# Patient Record
Sex: Female | Born: 1975 | Race: Black or African American | Hispanic: No | Smoking: Never smoker
Health system: Southern US, Community
[De-identification: ages and names within clinical notes are randomized; demographics above are authoritative.]

---

## 2004-08-03 ENCOUNTER — Emergency Department: Payer: Self-pay | Admitting: Emergency Medicine

## 2006-03-02 ENCOUNTER — Emergency Department: Payer: Self-pay | Admitting: Emergency Medicine

## 2006-08-26 ENCOUNTER — Emergency Department: Payer: Self-pay

## 2006-12-26 ENCOUNTER — Ambulatory Visit: Payer: Self-pay | Admitting: Family Medicine

## 2007-03-12 ENCOUNTER — Ambulatory Visit: Payer: Self-pay

## 2007-08-26 ENCOUNTER — Ambulatory Visit: Payer: Self-pay | Admitting: Certified Nurse Midwife

## 2008-01-22 ENCOUNTER — Emergency Department: Payer: Self-pay | Admitting: Emergency Medicine

## 2008-04-08 ENCOUNTER — Emergency Department: Payer: Self-pay | Admitting: Internal Medicine

## 2008-06-21 ENCOUNTER — Emergency Department: Payer: Self-pay | Admitting: Emergency Medicine

## 2009-02-03 ENCOUNTER — Ambulatory Visit: Payer: Self-pay | Admitting: Internal Medicine

## 2009-02-07 ENCOUNTER — Ambulatory Visit: Payer: Self-pay | Admitting: Internal Medicine

## 2009-02-17 ENCOUNTER — Ambulatory Visit: Payer: Self-pay | Admitting: Internal Medicine

## 2009-08-22 ENCOUNTER — Ambulatory Visit: Payer: Self-pay | Admitting: Internal Medicine

## 2009-11-01 ENCOUNTER — Emergency Department: Payer: Self-pay | Admitting: Emergency Medicine

## 2010-12-03 ENCOUNTER — Emergency Department: Payer: Self-pay | Admitting: Emergency Medicine

## 2014-12-13 ENCOUNTER — Encounter: Payer: Self-pay | Admitting: Emergency Medicine

## 2014-12-13 ENCOUNTER — Emergency Department
Admission: EM | Admit: 2014-12-13 | Discharge: 2014-12-13 | Disposition: A | Discharge status: Home / Self Care | Payer: Federal, State, Local not specified - PPO | Attending: Emergency Medicine | Admitting: Emergency Medicine

## 2014-12-13 ENCOUNTER — Emergency Department: Payer: Federal, State, Local not specified - PPO

## 2014-12-13 DIAGNOSIS — R202 Paresthesia of skin: Secondary | ICD-10-CM | POA: Diagnosis not present

## 2014-12-13 DIAGNOSIS — Z880 Allergy status to penicillin: Secondary | ICD-10-CM | POA: Insufficient documentation

## 2014-12-13 DIAGNOSIS — G43909 Migraine, unspecified, not intractable, without status migrainosus: Secondary | ICD-10-CM | POA: Diagnosis not present

## 2014-12-13 DIAGNOSIS — Z79899 Other long term (current) drug therapy: Secondary | ICD-10-CM | POA: Diagnosis not present

## 2014-12-13 DIAGNOSIS — R42 Dizziness and giddiness: Secondary | ICD-10-CM | POA: Diagnosis present

## 2014-12-13 DIAGNOSIS — Z793 Long term (current) use of hormonal contraceptives: Secondary | ICD-10-CM | POA: Diagnosis not present

## 2014-12-13 MED ORDER — METOCLOPRAMIDE HCL 5 MG/ML IJ SOLN
INTRAMUSCULAR | Status: AC
  Filled 2014-12-13: qty 4

## 2014-12-13 MED ORDER — DIPHENHYDRAMINE HCL 50 MG/ML IJ SOLN
INTRAMUSCULAR | Status: AC
  Administered 2014-12-13: 25 mg via INTRAVENOUS
  Filled 2014-12-13: qty 1

## 2014-12-13 MED ORDER — KETOROLAC TROMETHAMINE 30 MG/ML IJ SOLN
30.0000 mg | Freq: Once | INTRAMUSCULAR | Status: AC
  Administered 2014-12-13: 30 mg via INTRAVENOUS

## 2014-12-13 MED ORDER — BUTALBITAL-APAP-CAFFEINE 50-325-40 MG PO TABS: 1.0000 | ORAL_TABLET | Freq: Four times a day (QID) | ORAL | Status: AC | PRN

## 2014-12-13 MED ORDER — DIPHENHYDRAMINE HCL 50 MG/ML IJ SOLN
25.0000 mg | Freq: Once | INTRAMUSCULAR | Status: AC
  Administered 2014-12-13: 25 mg via INTRAVENOUS

## 2014-12-13 MED ORDER — KETOROLAC TROMETHAMINE 30 MG/ML IJ SOLN
INTRAMUSCULAR | Status: AC
  Administered 2014-12-13: 30 mg via INTRAVENOUS
  Filled 2014-12-13: qty 1

## 2014-12-13 MED ORDER — METOCLOPRAMIDE HCL 5 MG/ML IJ SOLN
20.0000 mg | Freq: Once | INTRAVENOUS | Status: AC
  Administered 2014-12-13: 20 mg via INTRAVENOUS
  Filled 2014-12-13: qty 4

## 2014-12-13 NOTE — ED Provider Notes (Signed)
[facility] Emergency Department Provider Note  ____________________________________________  Time seen: 1 PM  I have reviewed the triage vital signs and the nursing notes.   HISTORY  Chief Complaint Dizziness    HPI JOBY HERSHKOWITZ is a 39 y.o. female who presents with a headache for one week that is throbbing and primarily on the right side of her head. Patient has a history of migraines. She denies fevers chills. No neck pain. She became concerned because this morning she had tingling in her face and her right arm although no weakness. Symptoms resolved prior to arrival. She also notes vertigo-like symptoms that are worse when lying flat     History reviewed. No pertinent past medical history.  There are no active problems to display for this patient.   No past surgical history on file.  Current Outpatient Rx  Name  Route  Sig  Dispense  Refill  . norgestimate-ethinyl estradiol (ORTHO-CYCLEN,SPRINTEC,PREVIFEM) 0.25-35 MG-MCG tablet   Oral   Take 1 tablet by mouth daily.         . rizatriptan (MAXALT) 10 MG tablet   Oral   Take 10 mg by mouth as needed for migraine. May repeat in 2 hours if needed           Allergies Amoxicillin  No family history on file.  Social History History  Substance Use Topics  . Smoking status: Never Smoker   . Smokeless tobacco: Not on file  . Alcohol Use: 1.2 oz/week    2 Standard drinks or equivalent per week    Review of Systems  Constitutional: Negative for fever. Eyes: Negative for visual changes. ENT: Negative for sore throat. Positive for vertigo Cardiovascular: Negative for chest pain. Respiratory: Negative for shortness of breath. Gastrointestinal: Negative for abdominal pain, vomiting and diarrhea. Genitourinary: Negative for dysuria. Musculoskeletal: Negative for back pain. Skin: Negative for rash. Neurological: Positive for headache, positive for tingling Psychiatric: Negative for  anxiety  10-point ROS otherwise negative.  ____________________________________________   PHYSICAL EXAM:  VITAL SIGNS: BP 111/79 mmHg  Pulse 77  Resp 15  SpO2 98%    ED Triage Vitals  Enc Vitals Group     BP --      Pulse --      Resp --      Temp --      Temp src --      SpO2 --      Weight --      Height --      Head Cir --      Peak Flow --      Pain Score 12/13/14 0910 7     Pain Loc --      Pain Edu? --      Excl. in GC? --      Constitutional: Alert and oriented. Well appearing and in no distress. Eyes: Conjunctivae are normal. PERRL. Normal extraocular movements. ENT   Head: Normocephalic and atraumatic.   Nose: No congestion/rhinnorhea.   Mouth/Throat: Mucous membranes are moist.   Neck: No stridor. Hematological/Lymphatic/Immunilogical: No cervical lymphadenopathy. Cardiovascular: Normal rate, regular rhythm. Normal and symmetric distal pulses are present in all extremities. No murmurs, rubs, or gallops. Respiratory: Normal respiratory effort without tachypnea nor retractions. Breath sounds are clear and equal bilaterally. No wheezes/rales/rhonchi. Gastrointestinal: Soft and nontender. No distention. There is no CVA tenderness. Genitourinary: deferred Musculoskeletal: Nontender with normal range of motion in all extremities. No joint effusions.  No lower extremity tenderness nor edema. Neurologic:  Normal speech and language. No gross focal neurologic deficits are appreciated. Speech is normal.  Skin:  Skin is warm, dry and intact. No rash noted. Psychiatric: Mood and affect are normal. Speech and behavior are normal. Patient exhibits appropriate insight and judgment.  ____________________________________________    LABS (pertinent positives/negatives)  None  ____________________________________________   EKG  None  ____________________________________________    RADIOLOGY  Normal head  CT  ____________________________________________   PROCEDURES  Procedure(s) performed: None  Critical Care performed: None   ____________________________________________   INITIAL IMPRESSION / ASSESSMENT AND PLAN / ED COURSE  Pertinent labs & imaging results that were available during my care of the patient were reviewed by me and considered in my medical decision making (see chart for details).  Patient well-appearing. No neurologic deficits. Suspect migraine as source of discomfort. We will treat with IV medications in an attempt to provide patient with relief   ----------------------------------------- 2:54 PM on 12/13/2014 -----------------------------------------  Patient feeling significantly better. Asymptomatic ____________________________________________   FINAL CLINICAL IMPRESSION(S) / ED DIAGNOSES  Final diagnoses:  Migraine without status migrainosus, not intractable, unspecified migraine type     Jene Everyobert Nansi Birmingham, MD 12/15/14 1300

## 2014-12-13 NOTE — Discharge Instructions (Signed)

## 2014-12-13 NOTE — ED Notes (Signed)
States has had headache x 1 week, has history of migraines but states this feels different, states woke this am with dizzyness and some numbness R face, no droop. Denies sinus drainage.

## 2014-12-13 NOTE — ED Notes (Signed)
Pt resting in bed, lights off at this time.

## 2015-09-18 ENCOUNTER — Emergency Department: Payer: Federal, State, Local not specified - PPO

## 2015-09-18 ENCOUNTER — Emergency Department
Admission: EM | Admit: 2015-09-18 | Discharge: 2015-09-18 | Disposition: A | Discharge status: Home / Self Care | Payer: Federal, State, Local not specified - PPO | Attending: Emergency Medicine | Admitting: Emergency Medicine

## 2015-09-18 ENCOUNTER — Encounter: Payer: Self-pay | Admitting: *Deleted

## 2015-09-18 DIAGNOSIS — M79605 Pain in left leg: Secondary | ICD-10-CM | POA: Diagnosis not present

## 2015-09-18 DIAGNOSIS — Z880 Allergy status to penicillin: Secondary | ICD-10-CM | POA: Diagnosis not present

## 2015-09-18 MED ORDER — TRAMADOL HCL 50 MG PO TABS: 50.0000 mg | ORAL_TABLET | Freq: Four times a day (QID) | ORAL | Status: AC | PRN

## 2015-09-18 MED ORDER — IBUPROFEN 800 MG PO TABS: 800.0000 mg | ORAL_TABLET | Freq: Three times a day (TID) | ORAL | Status: AC | PRN

## 2015-09-18 NOTE — ED Notes (Signed)
Pt reports left lower leg pain for 1 day.  Sx began while driving today.  Pt states left calf hurts with pain radiating into left foot.

## 2015-09-18 NOTE — Discharge Instructions (Signed)

## 2015-09-18 NOTE — ED Provider Notes (Signed)
[facility] Emergency Department Provider Note     Time seen: ----------------------------------------- 9:21 PM on 09/18/2015 -----------------------------------------    I have reviewed the triage vital signs and the nursing notes.   HISTORY  Chief Complaint Leg Pain    HPI Kiara Barker is a 40 y.o. female who presents to ER with left lower leg pain for last 24 hours. Patient states symptoms began while driving today, states her left calf hurts the pain radiates into her foot. She's not had this happen before, denies any recent injuries or trauma. She denies swelling, fevers chills or other complaints.   No past medical history on file.  There are no active problems to display for this patient.   No past surgical history on file.  Allergies Amoxicillin  Social History Social History  Substance Use Topics  . Smoking status: Never Smoker   . Smokeless tobacco: None  . Alcohol Use: 1.2 oz/week    2 Standard drinks or equivalent per week    Review of Systems Constitutional: Negative for fever. Eyes: Negative for visual changes. ENT: Negative for sore throat. Cardiovascular: Negative for chest pain. Respiratory: Negative for shortness of breath. Gastrointestinal: Negative for abdominal pain, vomiting and diarrhea. Genitourinary: Negative for dysuria. Musculoskeletal: Positive for left leg pain Skin: Negative for rash. Neurological: Negative for headaches, focal weakness or numbness.  10-point ROS otherwise negative.  ____________________________________________   PHYSICAL EXAM:  VITAL SIGNS: ED Triage Vitals  Enc Vitals Group     BP 09/18/15 2031 138/90 mmHg     Pulse Rate 09/18/15 2031 82     Resp 09/18/15 2031 20     Temp 09/18/15 2031 98.3 F (36.8 C)     Temp src --      SpO2 09/18/15 2031 99 %     Weight 09/18/15 2031 200 lb (90.719 kg)     Height 09/18/15 2031  (1.626 m)     Head Cir --      Peak Flow --      Pain Score 09/18/15 2032 10     Pain Loc --      Pain Edu? --      Excl. in GC? --     Constitutional: Alert and oriented. Well appearing and in no distress. Eyes: Conjunctivae are normal. PERRL. Normal extraocular movements. ENT   Head: Normocephalic and atraumatic.   Nose: No congestion/rhinnorhea.   Mouth/Throat: Mucous membranes are moist.   Neck: No stridor. Cardiovascular: Normal rate, regular rhythm. Normal and symmetric distal pulses are present in all extremities. ABIs are normal. No murmurs, rubs, or gallops. Respiratory: Normal respiratory effort without tachypnea nor retractions. Breath sounds are clear and equal bilaterally. No wheezes/rales/rhonchi. Gastrointestinal: Soft and nontender. No distention. No abdominal bruits.  Musculoskeletal: Nontender with normal range of motion in all extremities. No joint effusions.  No lower extremity tenderness nor edema. Neurologic:  Normal speech and language. No gross focal neurologic deficits are appreciated. Speech is normal. No gait instability. Skin:  Skin is warm, dry and intact. No rash noted. Psychiatric: Mood and affect are normal. Speech and behavior are normal. Patient exhibits appropriate insight and judgment. ____________________________________________  ED COURSE:  Pertinent labs & imaging results that were available during my care of the patient were reviewed by me and considered in my medical decision making (see chart for details). Patient is in no acute distress, no clear etiology for her symptoms. Likely muscle cramps.  RADIOLOGY Lower extremity ultrasound Is unremarkable ____________________________________________  FINAL ASSESSMENT AND PLAN  Leg pain  Plan: Patient with imaging as dictated above. I will prescribe Motrin and tramadol for her symptoms. She is advised to follow-up with her doctor in 2 days for recheck.   Emily Filbert, MD   Emily Filbert, MD 09/18/15 [phone]

## 2015-09-27 ENCOUNTER — Other Ambulatory Visit: Payer: Self-pay | Admitting: Nurse Practitioner

## 2015-09-27 DIAGNOSIS — R109 Unspecified abdominal pain: Principal | ICD-10-CM

## 2015-09-27 DIAGNOSIS — G8929 Other chronic pain: Secondary | ICD-10-CM

## 2015-10-04 ENCOUNTER — Ambulatory Visit
Admission: RE | Admit: 2015-10-04 | Discharge: 2015-10-04 | Disposition: A | Discharge status: Home / Self Care | Payer: Federal, State, Local not specified - PPO | Source: Ambulatory Visit | Attending: Nurse Practitioner | Admitting: Nurse Practitioner

## 2015-10-04 DIAGNOSIS — G8929 Other chronic pain: Secondary | ICD-10-CM | POA: Diagnosis present

## 2015-10-04 DIAGNOSIS — R109 Unspecified abdominal pain: Secondary | ICD-10-CM | POA: Insufficient documentation

## 2015-10-04 MED ORDER — IOHEXOL 300 MG/ML  SOLN
100.0000 mL | Freq: Once | INTRAMUSCULAR | Status: AC | PRN
  Administered 2015-10-04: 100 mL via INTRAVENOUS

## 2016-10-23 IMAGING — CT CT ABD-PELV W/ CM
2 of 4 series · 16 of 46 positions shown, 18 images · IV contrast (omnipaque)
Comparison: None.

CLINICAL DATA: Upper abdominal and epigastric pain for 6 months.

EXAM:
CT ABDOMEN AND PELVIS WITH CONTRAST
TECHNIQUE: Multidetector CT imaging of the abdomen and pelvis was performed
using the standard protocol following bolus administration of
intravenous contrast.
CONTRAST:  100mL OMNIPAQUE IOHEXOL 300 MG/ML  SOLN

[Series 2: axial soft tissue · axial · 0.61mm/px · z∈[-727,-297]mm · 13 of 96 slices shown, 15 images]
[im 5/96  soft-tissue]
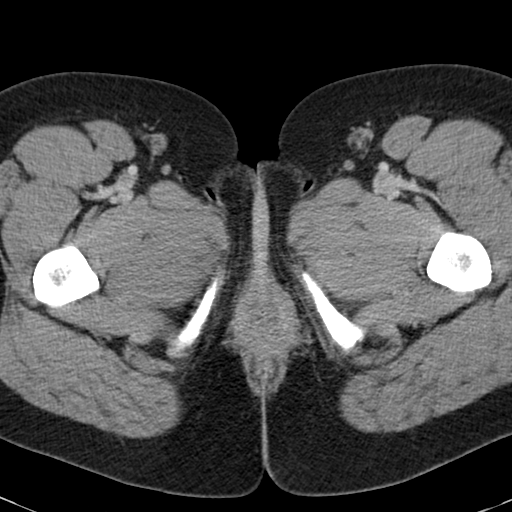
[im 5/96  bone]
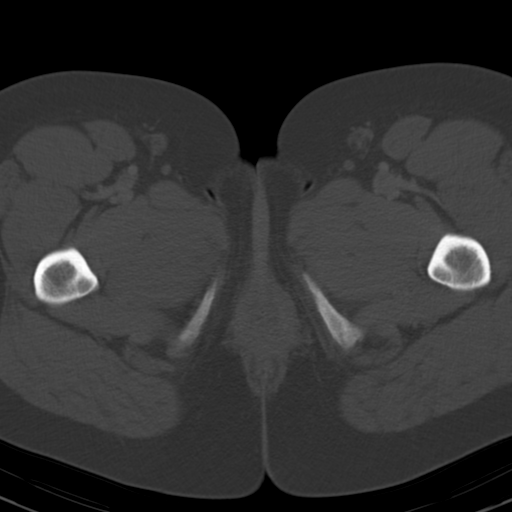
[im 13/96  soft-tissue]
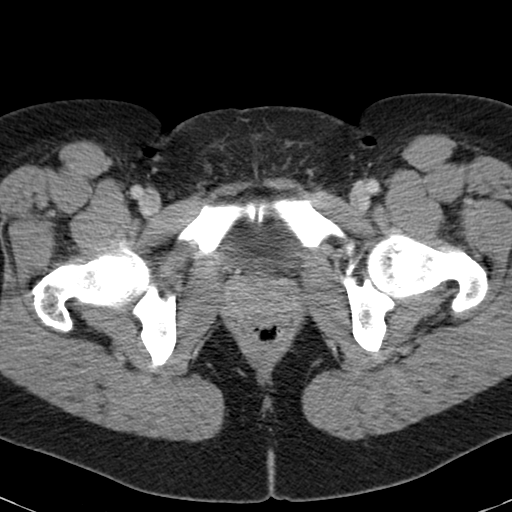
[im 21/96  soft-tissue]
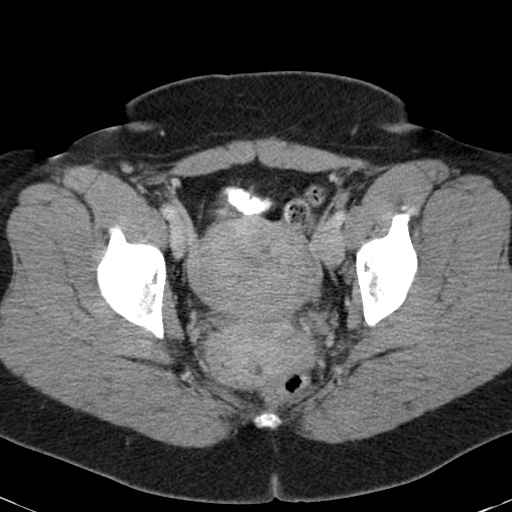
[im 25/96  soft-tissue]
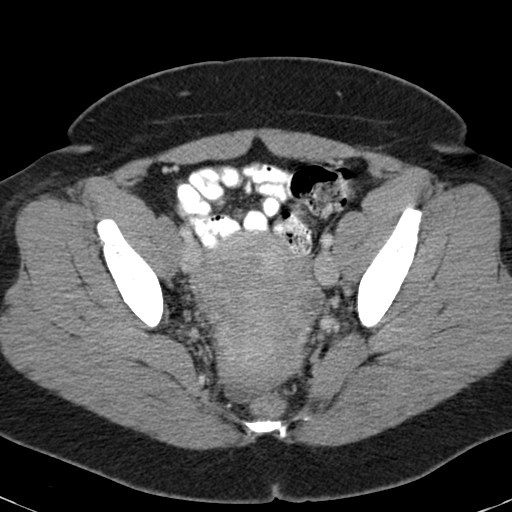
[im 34/96  soft-tissue]
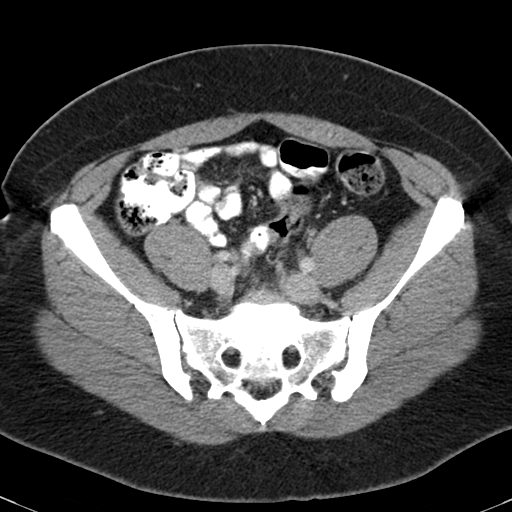
[im 42/96  soft-tissue]
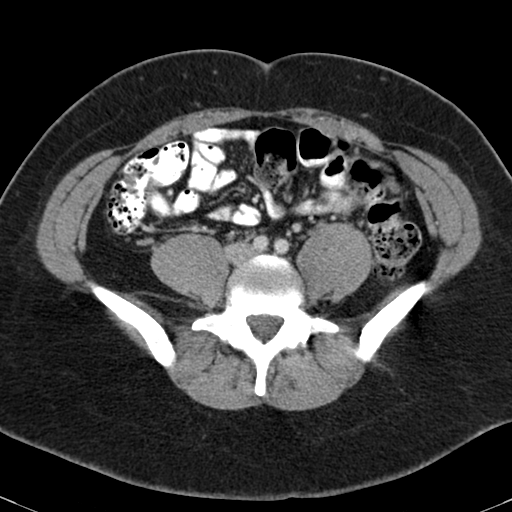
[im 50/96  soft-tissue]
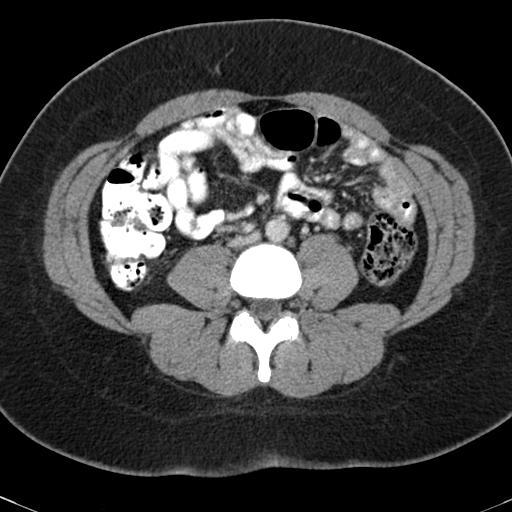
[im 54/96  soft-tissue]
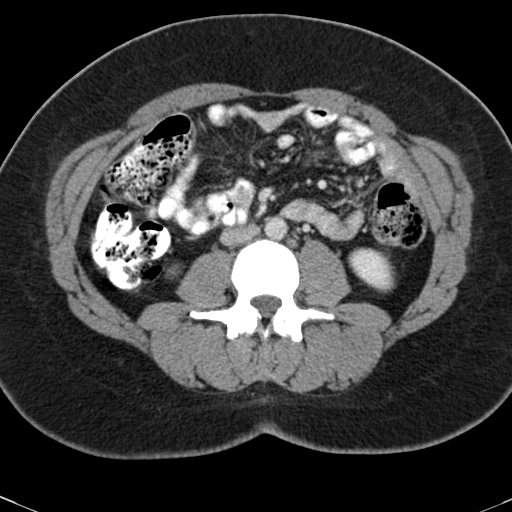
[im 62/96  soft-tissue]
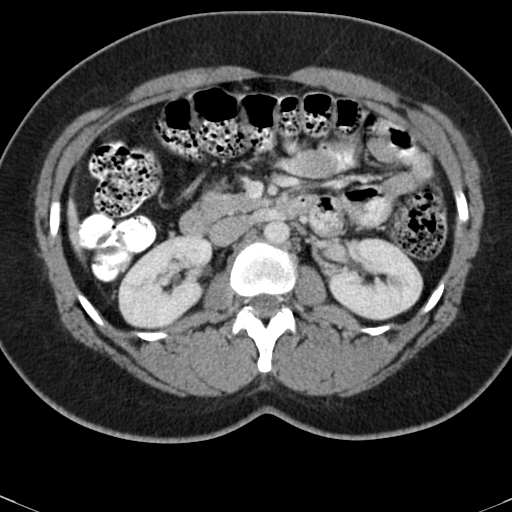
[im 62/96  bone]
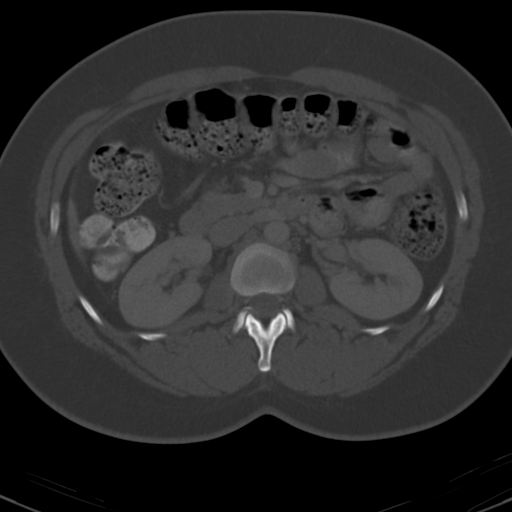
[im 71/96  soft-tissue]
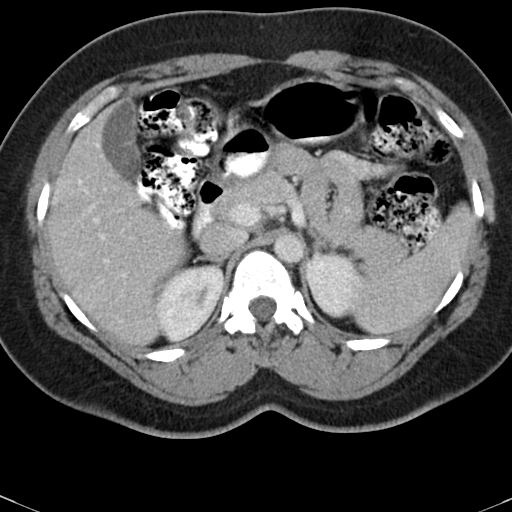
[im 75/96  soft-tissue]
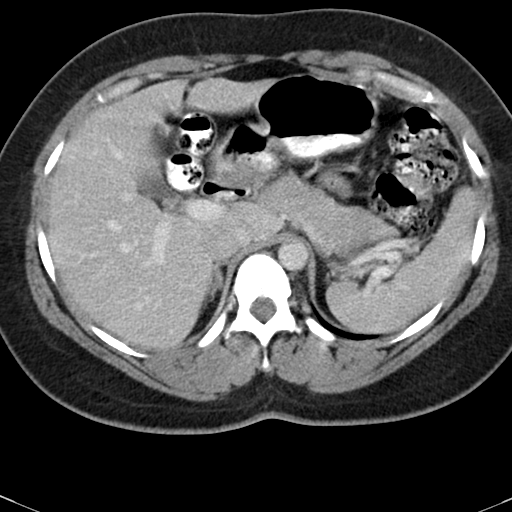
[im 83/96  soft-tissue]
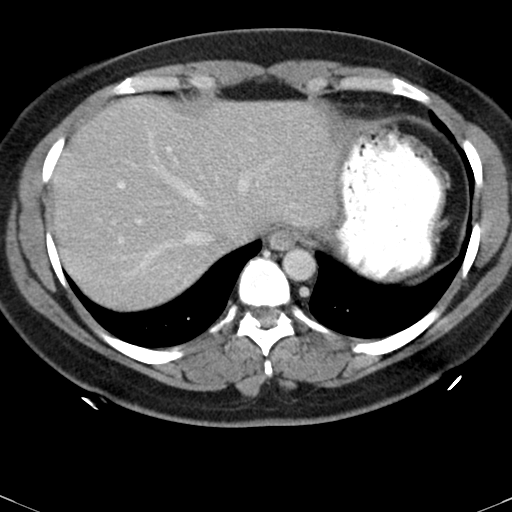
[im 91/96  soft-tissue]
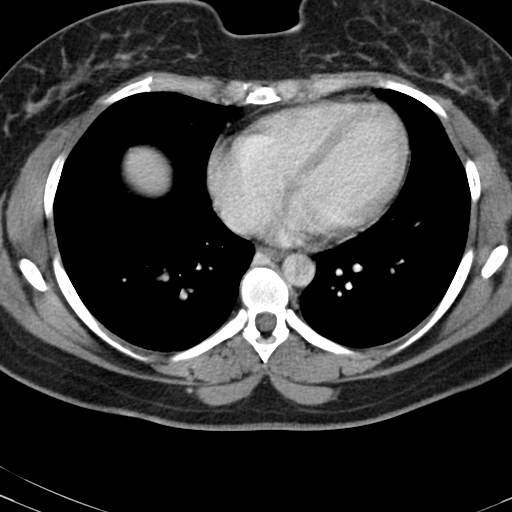

[Series 602: coronal · coronal · 0.92mm/px · 3 of 115 slices shown]
[im 39/115  soft-tissue]
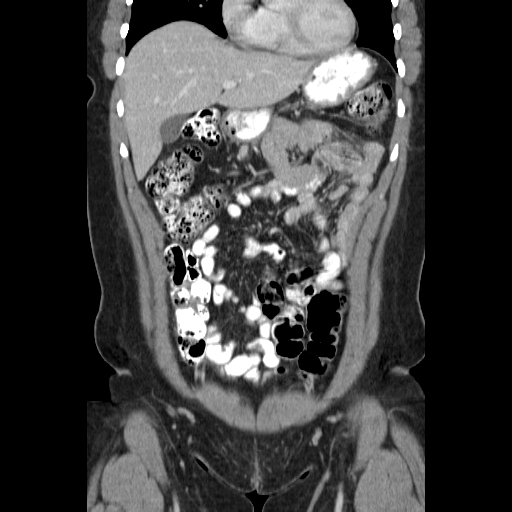
[im 51/115  soft-tissue]
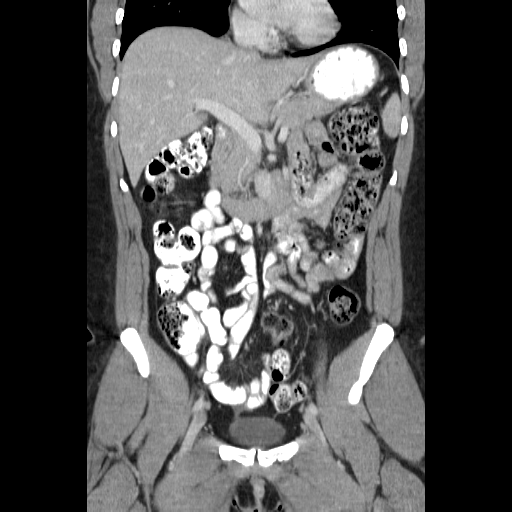
[im 64/115  soft-tissue]
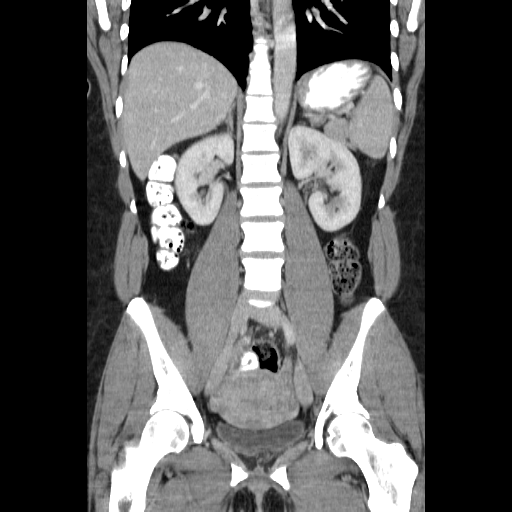

[16 of 46 positions shown; findings below may reference images not displayed]

FINDINGS: Lower chest:  Unremarkable.

Hepatobiliary: No focal abnormality within the liver parenchyma.
There is no evidence for gallstones, gallbladder wall thickening, or
pericholecystic fluid. No intrahepatic or extrahepatic biliary
dilation.

Pancreas: No focal mass lesion. No dilatation of the main duct. No
intraparenchymal cyst. No peripancreatic edema.

Spleen: No splenomegaly. No focal mass lesion.

Adrenals/Urinary Tract: No adrenal nodule or mass. Kidneys are
unremarkable. No evidence for hydroureter. The urinary bladder
appears normal for the degree of distention.

Stomach/Bowel: Stomach is nondistended. No gastric wall thickening.
No evidence of outlet obstruction. Duodenum is normally positioned
as is the ligament of Treitz. No small bowel wall thickening. No
small bowel dilatation. The terminal ileum is normal. The appendix
is normal. No gross colonic mass. No colonic wall thickening. No
substantial diverticular change.

Vascular/Lymphatic: No abdominal aortic aneurysm. No abdominal
aortic atherosclerotic calcification. There is no gastrohepatic or
hepatoduodenal ligament lymphadenopathy. No intraperitoneal or
retroperitoneal lymphadenopathy. No pelvic sidewall lymphadenopathy.

Reproductive: The uterus has normal CT imaging appearance. Dominant
follicle noted right ovary. Left ovary unremarkable.

Other: Trace amount of free fluid is identified in the cul-de-sac.

Musculoskeletal: Bone windows reveal no worrisome lytic or sclerotic
osseous lesions.
IMPRESSION: 1. No acute findings in the abdomen or pelvis. Specifically, no
features to explain the patient's history of pain.

## 2019-06-01 ENCOUNTER — Other Ambulatory Visit: Payer: Self-pay

## 2019-06-01 DIAGNOSIS — Z20822 Contact with and (suspected) exposure to covid-19: Secondary | ICD-10-CM

## 2019-06-04 LAB — NOVEL CORONAVIRUS, NAA: SARS-CoV-2, NAA: NOT DETECTED

## 2021-02-23 ENCOUNTER — Other Ambulatory Visit: Payer: Self-pay | Admitting: Family Medicine

## 2021-02-23 ENCOUNTER — Other Ambulatory Visit (HOSPITAL_COMMUNITY): Payer: Self-pay | Admitting: Family Medicine

## 2021-02-23 DIAGNOSIS — R102 Pelvic and perineal pain: Secondary | ICD-10-CM

## 2021-02-27 ENCOUNTER — Ambulatory Visit
Admission: RE | Admit: 2021-02-27 | Discharge: 2021-02-27 | Disposition: A | Discharge status: Home / Self Care | Payer: BC Managed Care – PPO | Source: Ambulatory Visit | Attending: Family Medicine | Admitting: Family Medicine

## 2021-02-27 ENCOUNTER — Other Ambulatory Visit: Payer: Self-pay

## 2021-02-27 DIAGNOSIS — R102 Pelvic and perineal pain: Secondary | ICD-10-CM | POA: Diagnosis present

## 2021-07-19 ENCOUNTER — Other Ambulatory Visit: Payer: Self-pay

## 2021-07-19 ENCOUNTER — Ambulatory Visit
Admission: EM | Admit: 2021-07-19 | Discharge: 2021-07-19 | Disposition: A | Discharge status: Home / Self Care | Payer: 59

## 2021-07-19 DIAGNOSIS — H6123 Impacted cerumen, bilateral: Secondary | ICD-10-CM

## 2021-07-19 DIAGNOSIS — H9201 Otalgia, right ear: Secondary | ICD-10-CM

## 2021-07-19 NOTE — ED Notes (Signed)
Ear was/removal attempted in right ear (stopped due to pain). Left ear (wax removed completely). Advised provider.

## 2021-07-19 NOTE — ED Triage Notes (Signed)
Patient is here for "Right Ear pain". "A lot of ear impactions with ear wax often". Yesterday started hurting, put some ear wax removal to help, & did dig in it some". No fever. No other injury known. No URI symptoms.

## 2021-07-19 NOTE — ED Provider Notes (Signed)
[facility]    CSN: [id number] Arrival date & time: 07/19/21  0825      History   Chief Complaint Chief Complaint  Patient presents with   Otalgia    Right     HPI Kiara Barker is a 45 y.o. female presenting for right-sided ear pain for the past several days.  Patient reports Kiara Barker has a lot of problems with earwax buildup in her ears.  Kiara Barker says Kiara Barker tried remove the wax by letting some water run into the ear and using a rag to wipe away the wax yesterday.  Reports no improvement and Kiara Barker felt like there was a hard ball of wax there.  Reports that Kiara Barker woke up today with severe pain and the pain is not gone away.  No drainage from ear.  No fevers.  Hearing seems to be normal.  No dizziness, headaches or vomiting reported.  No other complaints.  HPI  History reviewed. No pertinent past medical history.  There are no problems to display for this patient.   History reviewed. No pertinent surgical history.  OB History   No obstetric history on file.      Home Medications    Prior to Admission medications   Medication Sig Start Date End Date Taking? Authorizing Provider  acetaminophen (TYLENOL) 325 MG tablet Take 650 mg by mouth every 6 (six) hours as needed.   Yes [provider]  UNABLE TO FIND Med Name: Ear gtts (name?)   Yes [provider]  ibuprofen (ADVIL,MOTRIN) 800 MG tablet Take 1 tablet (800 mg total) by mouth every 8 (eight) hours as needed. 09/18/15   Emily Filbert, MD  norgestimate-ethinyl estradiol (ORTHO-CYCLEN,SPRINTEC,PREVIFEM) 0.25-35 MG-MCG tablet Take 1 tablet by mouth daily.    [provider]  rizatriptan (MAXALT) 10 MG tablet Take 10 mg by mouth as needed for migraine. May repeat in 2 hours if needed    [provider]    Family History No family history on file.  Social History Social History   Tobacco Use   Smoking status: Never  Vaping Use   Vaping Use: Never used  Substance Use Topics    Alcohol use: Yes    Alcohol/week: 2.0 standard drinks    Types: 2 Standard drinks or equivalent per week    Comment: Occassionally.   Drug use: Not Currently     Allergies   Ampicillin, Clindamycin hcl, and Amoxicillin   Review of Systems Review of Systems  Constitutional:  Negative for chills, diaphoresis, fatigue and fever.  HENT:  Positive for ear pain. Negative for congestion, ear discharge, hearing loss, rhinorrhea and sore throat.   Respiratory:  Negative for cough.   Gastrointestinal:  Negative for nausea and vomiting.  Musculoskeletal:  Negative for myalgias.  Skin:  Negative for rash.  Neurological:  Negative for dizziness, weakness and headaches.  Hematological:  Negative for adenopathy.    Physical Exam Triage Vital Signs ED Triage Vitals [07/19/21 0941]  Enc Vitals Group     BP 115/79     Pulse Rate 83     Resp 18     Temp 98.3 F (36.8 C)     Temp Source Oral     SpO2 100 %     Weight 175 lb (79.4 kg)     Height      Head Circumference      Peak Flow      Pain Score 10     Pain Loc  Pain Edu?      Excl. in GC?    No data found.  Updated Vital Signs BP 115/79 (BP Location: Left Arm)    Pulse 83    Temp 98.3 F (36.8 C) (Oral)    Resp 18    Wt 175 lb (79.4 kg)    LMP  (LMP Unknown) Comment: 2018   SpO2 100%    BMI 30.04 kg/m      Physical Exam Vitals and nursing note reviewed.  Constitutional:      General: Kiara Barker is not in acute distress.    Appearance: Normal appearance. Kiara Barker is not ill-appearing or toxic-appearing.  HENT:     Head: Normocephalic and atraumatic.     Right Ear: Hearing and external ear normal. There is impacted cerumen.     Left Ear: Hearing and external ear normal. There is impacted cerumen.     Nose: Nose normal.     Mouth/Throat:     Mouth: Mucous membranes are moist.     Pharynx: Oropharynx is clear.  Eyes:     General: No scleral icterus.       Right eye: No discharge.        Left eye: No discharge.      Conjunctiva/sclera: Conjunctivae normal.  Cardiovascular:     Rate and Rhythm: Normal rate and regular rhythm.     Heart sounds: Normal heart sounds.  Pulmonary:     Effort: Pulmonary effort is normal. No respiratory distress.     Breath sounds: Normal breath sounds.  Musculoskeletal:     Cervical back: Neck supple.  Skin:    General: Skin is dry.  Neurological:     General: No focal deficit present.     Mental Status: Kiara Barker is alert. Mental status is at baseline.     Motor: No weakness.     Gait: Gait normal.  Psychiatric:        Mood and Affect: Mood normal.        Behavior: Behavior normal.        Thought Content: Thought content normal.     UC Treatments / Results  Labs (all labs ordered are listed, but only abnormal results are displayed) Labs Reviewed - No data to display  EKG   Radiology No results found.  Procedures Procedures (including critical care time)  Medications Ordered in UC Medications - No data to display  Initial Impression / Assessment and Plan / UC Course  I have reviewed the triage vital signs and the nursing notes.  Pertinent labs & imaging results that were available during my care of the patient were reviewed by me and considered in my medical decision making (see chart for details).  45 year old female presenting for right-sided ear pain.  On exam Kiara Barker has cerumen impaction bilaterally.  Otic flush performed by nursing staff.  Patient had some difficulty tolerating it on the right ear but was able to push through the pain.  Her examination shows clear EACs bilaterally.  No evidence of infection bilaterally.  Patient still having some discomfort but reports being able to hear better.  Advised Tylenol/Motrin and warm compresses.  Advised to follow-up if your pain is worsening or Kiara Barker has ear drainage, or fever.  Final Clinical Impressions(s) / UC Diagnoses   Final diagnoses:  Bilateral impacted cerumen  Right ear pain   Discharge Instructions    None    ED Prescriptions   None    PDMP not reviewed this encounter.   Michiel Cowboy,  Algis Greenhouse, PA-C 07/19/21 1237

## 2022-09-06 ENCOUNTER — Other Ambulatory Visit: Payer: Self-pay | Admitting: Family Medicine

## 2022-09-06 DIAGNOSIS — R102 Pelvic and perineal pain unspecified side: Secondary | ICD-10-CM

## 2022-09-06 DIAGNOSIS — N9412 Deep dyspareunia: Secondary | ICD-10-CM

## 2022-09-11 ENCOUNTER — Encounter: Payer: Self-pay | Admitting: Family Medicine

## 2022-09-11 DIAGNOSIS — R102 Pelvic and perineal pain: Secondary | ICD-10-CM

## 2022-09-11 DIAGNOSIS — N9412 Deep dyspareunia: Secondary | ICD-10-CM

## 2022-09-17 ENCOUNTER — Ambulatory Visit
Admission: RE | Admit: 2022-09-17 | Discharge: 2022-09-17 | Disposition: A | Discharge status: Home / Self Care | Payer: 59 | Source: Ambulatory Visit | Attending: Family Medicine | Admitting: Family Medicine

## 2022-09-17 DIAGNOSIS — N9412 Deep dyspareunia: Secondary | ICD-10-CM

## 2022-09-17 DIAGNOSIS — R102 Pelvic and perineal pain: Secondary | ICD-10-CM

## 2023-02-06 ENCOUNTER — Ambulatory Visit
Admission: EM | Admit: 2023-02-06 | Discharge: 2023-02-06 | Disposition: A | Discharge status: Home / Self Care | Payer: 59 | Attending: Emergency Medicine | Admitting: Emergency Medicine

## 2023-02-06 ENCOUNTER — Ambulatory Visit (INDEPENDENT_AMBULATORY_CARE_PROVIDER_SITE_OTHER): Payer: 59

## 2023-02-06 DIAGNOSIS — Z1152 Encounter for screening for COVID-19: Secondary | ICD-10-CM | POA: Diagnosis not present

## 2023-02-06 DIAGNOSIS — K5900 Constipation, unspecified: Secondary | ICD-10-CM | POA: Diagnosis present

## 2023-02-06 DIAGNOSIS — H6123 Impacted cerumen, bilateral: Secondary | ICD-10-CM | POA: Diagnosis not present

## 2023-02-06 DIAGNOSIS — R42 Dizziness and giddiness: Secondary | ICD-10-CM | POA: Insufficient documentation

## 2023-02-06 LAB — SARS CORONAVIRUS 2 BY RT PCR: SARS Coronavirus 2 by RT PCR: NEGATIVE

## 2023-02-06 NOTE — ED Provider Notes (Addendum)
[facility]    CSN: [id number] Arrival date & time: 02/06/23  1508      History   Chief Complaint Chief Complaint  Patient presents with   Shortness of Breath   Nausea   Dizziness    HPI NILAM QUAKENBUSH is a 47 y.o. female.   HPI She denies any fever or upper or lower respiratory symptoms.  She also denies vomiting or diarrhea.  No abdominal pain or distention.  She did have a colonoscopy 6 days ago and reports that she has not had a bowel movement since her colonoscopy.  She has been drinking fluids but not a lot and she is also been eating, but again not a lot.  She denies any sick contacts.  History reviewed. No pertinent past medical history.  There are no problems to display for this patient.   History reviewed. No pertinent surgical history.  OB History   No obstetric history on file.      Home Medications    Prior to Admission medications   Medication Sig Start Date End Date Taking? Authorizing Provider  Calcium-Vitamin D-Vitamin K 650-12.5-40 MG-MCG-MCG CHEW Chew by mouth.   Yes [provider]  estradiol (VIVELLE-DOT) 0.1 MG/24HR patch Place patch on abdomen or upper buttocks and change every 3 days. 09/30/15  Yes [provider]  famotidine (PEPCID) 40 MG tablet Take 40 mg by mouth 2 (two) times daily.   Yes [provider]  Multiple Vitamin (MULTI-VITAMIN) tablet Take 1 tablet by mouth daily.   Yes [provider]  omeprazole (PRILOSEC) 40 MG capsule Take 40 mg by mouth 2 (two) times daily.   Yes [provider]  SEMAGLUTIDE PO Take by mouth once. Once a week   Yes [provider]  acetaminophen (TYLENOL) 325 MG tablet Take 650 mg by mouth every 6 (six) hours as needed.    [provider]  ibuprofen (ADVIL,MOTRIN) 800 MG tablet Take 1 tablet (800 mg total) by mouth every 8 (eight) hours as needed. 09/18/15   Emily Filbert, MD  norgestimate-ethinyl estradiol  (ORTHO-CYCLEN,SPRINTEC,PREVIFEM) 0.25-35 MG-MCG tablet Take 1 tablet by mouth daily.    [provider]  rizatriptan (MAXALT) 10 MG tablet Take 10 mg by mouth as needed for migraine. May repeat in 2 hours if needed    [provider]  UNABLE TO FIND Med Name: Ear gtts (name?)    [provider]    Family History No family history on file.  Social History Social History   Tobacco Use   Smoking status: Never  Vaping Use   Vaping status: Never Used  Substance Use Topics   Alcohol use: Yes    Alcohol/week: 2.0 standard drinks of alcohol    Types: 2 Standard drinks or equivalent per week    Comment: Occassionally.   Drug use: Not Currently     Allergies   Ampicillin, Clindamycin hcl, and Amoxicillin   Review of Systems Review of Systems  Constitutional:  Negative for fever.  HENT:  Negative for congestion, ear pain, rhinorrhea and sore throat.   Respiratory:  Positive for shortness of breath. Negative for cough and wheezing.   Gastrointestinal:  Positive for nausea. Negative for abdominal distention, abdominal pain, diarrhea and vomiting.     Physical Exam Triage Vital Signs ED Triage Vitals  Encounter Vitals Group     BP      Systolic BP Percentile      Diastolic BP Percentile  Pulse      Resp      Temp      Temp src      SpO2      Weight      Height      Head Circumference      Peak Flow      Pain Score      Pain Loc      Pain Education      Exclude from Growth Chart    No data found.  Updated Vital Signs BP 101/73 (BP Location: Right Arm)   Pulse 98   Temp 98.7 F (37.1 C) (Oral)   LMP  (LMP Unknown) Comment: 2018  SpO2 100%   Visual Acuity Right Eye Distance:   Left Eye Distance:   Bilateral Distance:    Right Eye Near:   Left Eye Near:    Bilateral Near:     Physical Exam Vitals and nursing note reviewed.  Constitutional:      Appearance: Normal appearance. She is not ill-appearing.  HENT:     Head:  Normocephalic and atraumatic.     Right Ear: There is impacted cerumen.     Left Ear: There is impacted cerumen.     Ears:     Comments: Dry, hard cerumen in both external auditory canals.    Mouth/Throat:     Mouth: Mucous membranes are moist.     Pharynx: Oropharynx is clear. No oropharyngeal exudate or posterior oropharyngeal erythema.  Cardiovascular:     Rate and Rhythm: Normal rate and regular rhythm.     Pulses: Normal pulses.     Heart sounds: Normal heart sounds. No murmur heard.    No friction rub. No gallop.  Pulmonary:     Effort: Pulmonary effort is normal.     Breath sounds: Normal breath sounds. No wheezing, rhonchi or rales.  Abdominal:     General: Abdomen is flat.     Palpations: Abdomen is soft.     Tenderness: There is no abdominal tenderness. There is no guarding or rebound.  Musculoskeletal:     Cervical back: Normal range of motion and neck supple.  Lymphadenopathy:     Cervical: No cervical adenopathy.  Skin:    General: Skin is warm and dry.     Capillary Refill: Capillary refill takes less than 2 seconds.  Neurological:     General: No focal deficit present.     Mental Status: She is alert and oriented to person, place, and time.      UC Treatments / Results  Labs (all labs ordered are listed, but only abnormal results are displayed) Labs Reviewed  SARS CORONAVIRUS 2 BY RT PCR    EKG   Radiology DG Abd 2 Views  Result Date: 02/06/2023 CLINICAL DATA:  Shortness of breath, nausea, and lightheadedness worse since yesterday. EXAM: ABDOMEN - 2 VIEW COMPARISON:  CT 10/04/2015 FINDINGS: Gas and stool throughout the colon. No small or large bowel distention. No free intra-abdominal air. No abnormal air-fluid levels. Postoperative changes in the right upper quadrant and left upper quadrant. No radiopaque stones. Visualized bones and soft tissue contours appear intact. Lung bases are clear. IMPRESSION: Normal nonobstructive bowel gas pattern.  Electronically Signed   By: Burman Nieves M.D.   On: 02/06/2023 17:07    Procedures Procedures (including critical care time)  Medications Ordered in UC Medications - No data to display  Initial Impression / Assessment and Plan / UC Course  I have reviewed  the triage vital signs and the nursing notes.  Pertinent labs & imaging results that were available during my care of the patient were reviewed by me and considered in my medical decision making (see chart for details).   Patient is a very pleasant, nontoxic-appearing 47 year old female presenting for evaluation of shortness breath, nausea, lightheadedness and dizziness that started yesterday.  She is not experiencing any upper or lower respiratory symptoms.  She is also experiencing vomiting or diarrhea.  She has not had a bowel movement in the last 6 days since she had a colonoscopy even though she has been eating and drinking.  She does report that her food intake and fluid intake has been very little.  On exam she does have a impacted cerumen in both external auditory canals so I am unable to visualize the tympanic membranes.  I will order ear lavage and reassess for the presence of fluid which may be explaining some of the patient's dizziness and lightheadedness.  It is more likely that it is secondary to her poor fluid and calorie intake.  Her abdomen is soft and nontender.  I will obtain a two-view abdomen to evaluate for possible developing obstruction or constipation since patient has not had a bowel movement in 6 days.  She does report that she is passing flatus so a small bowel obstruction is less likely.  Additionally, I will also order a COVID test.  Radiology impression of abdominal x-rays states gas and stool throughout the colon with no small or large bowel distention.  Nonobstructive bowel gas pattern.  After ear lavage the left EAC is clear in tympanic membrane is pearly gray in appearance.  The right EAC continues to have  cerumen though I am able to visualize the superior aspect of the tympanic membrane which is pearly gray in appearance.  COVID PCR is negative.  I will discharge patient home with diagnosis of constipation and have her use MiraLAX to help facilitate the clearing of the buildup of stool throughout her bowel.  I will also prescribe Zofran that she can use as needed for nausea.  Additionally, I am going to encourage her to increase her oral fiber intake as well as increase her oral water intake and see if this helps her symptoms.   Final Clinical Impressions(s) / UC Diagnoses   Final diagnoses:  Constipation, unspecified constipation type  Dizziness  Bilateral impacted cerumen     Discharge Instructions      Your COVID test today was negative and your x-rays of your abdomen showed that you have some stool scattered throughout your colon.  I do believe that this Modigraf constipation is what is contributing to your nausea.  Increase your oral fiber intake so that shoeing crease bulk your stool and decreased transit time through your bowel.  Use over-the-counter MiraLAX, 1 capful in 8 ounces of a beverage of your choice, daily until you achieve regular bowel movements.  I would also increase your oral fluid intake as some of your lightheadedness and dizziness may be coming from the fact that you are not drinking enough fluid.  If you have any worsening lightheadedness, dizziness, suffer any fainting spells, or develop abdominal pain, nausea and vomiting not controlled with Zofran, or other concerning symptoms with return for reevaluation or seek care in the ER.     ED Prescriptions   None    PDMP not reviewed this encounter.   Becky Augusta, NP 02/06/23 1745    Becky Augusta, NP 02/06/23 1745

## 2023-02-06 NOTE — Discharge Instructions (Addendum)
Your COVID test today was negative and your x-rays of your abdomen showed that you have some stool scattered throughout your colon.  I do believe that this Modigraf constipation is what is contributing to your nausea.  Increase your oral fiber intake so that shoeing crease bulk your stool and decreased transit time through your bowel.  Use over-the-counter MiraLAX, 1 capful in 8 ounces of a beverage of your choice, daily until you achieve regular bowel movements.  I would also increase your oral fluid intake as some of your lightheadedness and dizziness may be coming from the fact that you are not drinking enough fluid.  If you have any worsening lightheadedness, dizziness, suffer any fainting spells, or develop abdominal pain, nausea and vomiting not controlled with Zofran, or other concerning symptoms with return for reevaluation or seek care in the ER.

## 2023-02-06 NOTE — ED Triage Notes (Addendum)
Pt presents to UC for SOB, nausea, lightheaded worsening since yesterday. Pt also reports some dizziness. Pt denies any sick contacts but states she did have a colonoscopy last Thursday and unsure if sxs have anything to do from that.
